# Patient Record
Sex: Female | Born: 1990 | Race: White | Hispanic: No | Marital: Single
Health system: Southern US, Community
[De-identification: ages and names within clinical notes are randomized; demographics above are authoritative.]

---

## 2012-01-04 ENCOUNTER — Emergency Department: Payer: Self-pay | Admitting: Emergency Medicine

## 2012-01-04 LAB — URINALYSIS, COMPLETE
Bilirubin,UR: NEGATIVE
Blood: NEGATIVE
Ketone: NEGATIVE
Ph: 7 (ref 4.5–8.0)
Protein: NEGATIVE
RBC,UR: 1 /HPF (ref 0–5)

## 2012-01-04 LAB — WET PREP, GENITAL

## 2012-04-07 ENCOUNTER — Observation Stay: Payer: Self-pay

## 2012-04-07 LAB — URINALYSIS, COMPLETE
Bacteria: NONE SEEN
Bilirubin,UR: NEGATIVE
Blood: NEGATIVE
Glucose,UR: NEGATIVE mg/dL (ref 0–75)
Ketone: NEGATIVE
Leukocyte Esterase: NEGATIVE
Ph: 8 (ref 4.5–8.0)
Protein: NEGATIVE
Specific Gravity: 1.005 (ref 1.003–1.030)
Squamous Epithelial: 1
WBC UR: <1 /HPF (ref 0–5)

## 2012-05-22 ENCOUNTER — Observation Stay: Payer: Self-pay | Admitting: Obstetrics and Gynecology

## 2012-05-23 ENCOUNTER — Observation Stay: Payer: Self-pay | Admitting: Obstetrics and Gynecology

## 2012-05-25 ENCOUNTER — Observation Stay: Payer: Self-pay | Admitting: Obstetrics and Gynecology

## 2012-05-26 ENCOUNTER — Inpatient Hospital Stay: Payer: Self-pay | Admitting: Obstetrics and Gynecology

## 2012-05-27 LAB — HEMATOCRIT: HCT: 34.9 % — ABNORMAL LOW (ref 35.0–47.0)

## 2012-05-30 LAB — PATHOLOGY REPORT

## 2013-12-20 IMAGING — US US OB LIMITED
1 series · 14 of 28 positions shown · non-contrast
Comparison: none

REASON FOR EXAM: abdominal pain
COMMENTS:   May transport without cardiac monitor

[Series 1: us ob limited · 0.28mm/px · 14 of 43 slices shown]
[im 2/43]
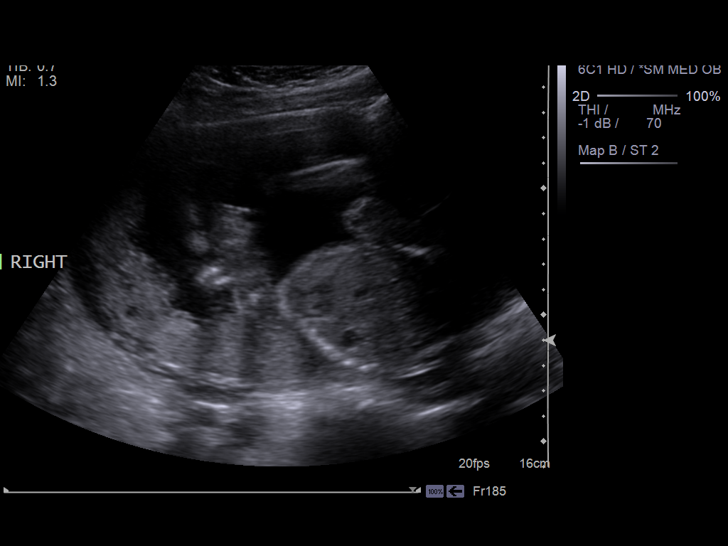
[im 5/43]
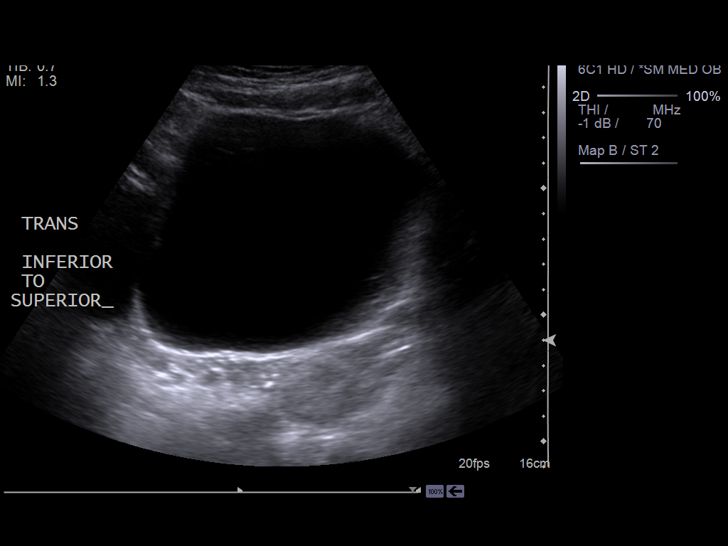
[im 8/43]
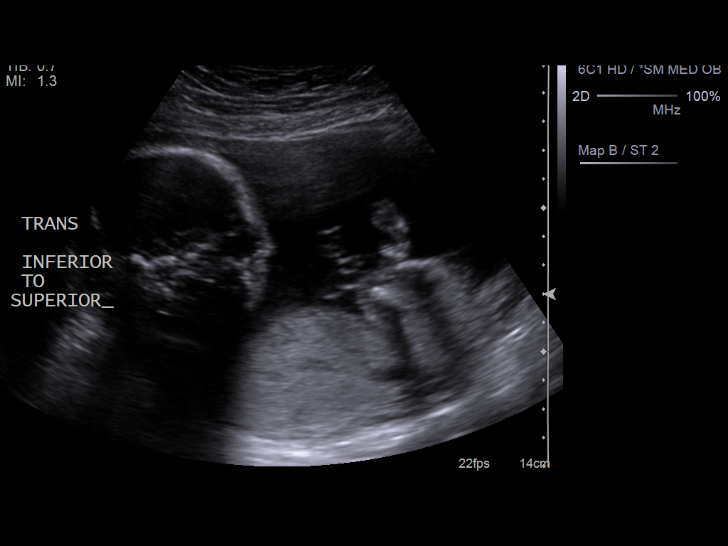
[im 11/43]
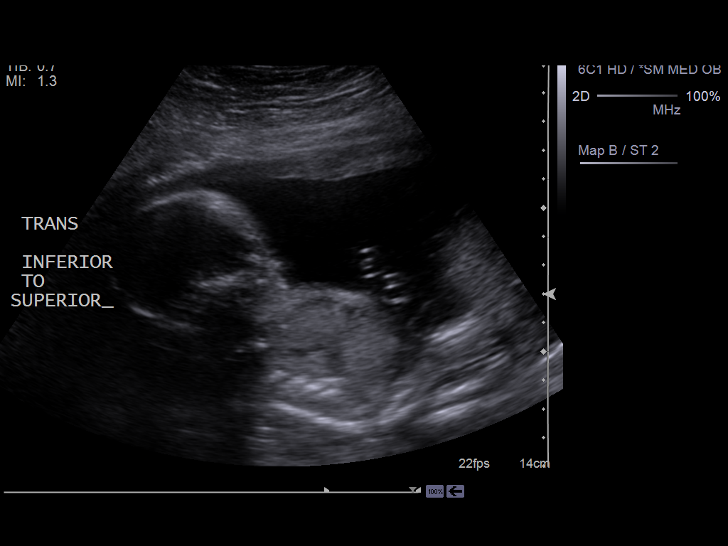
[im 15/43]
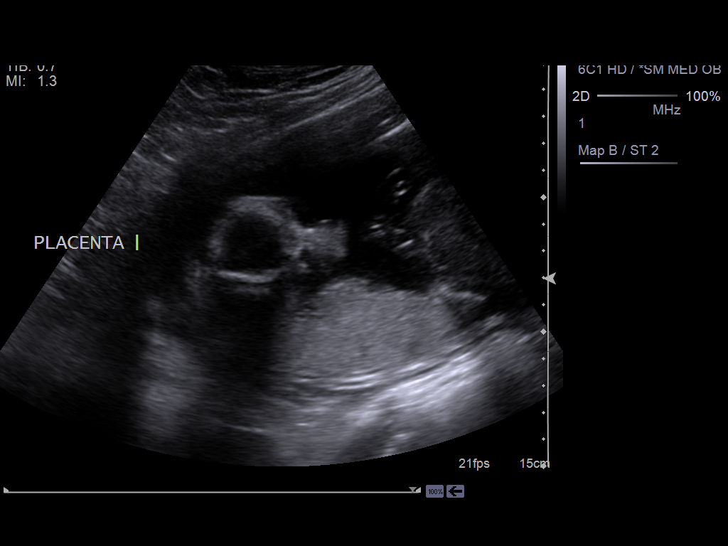
[im 18/43]
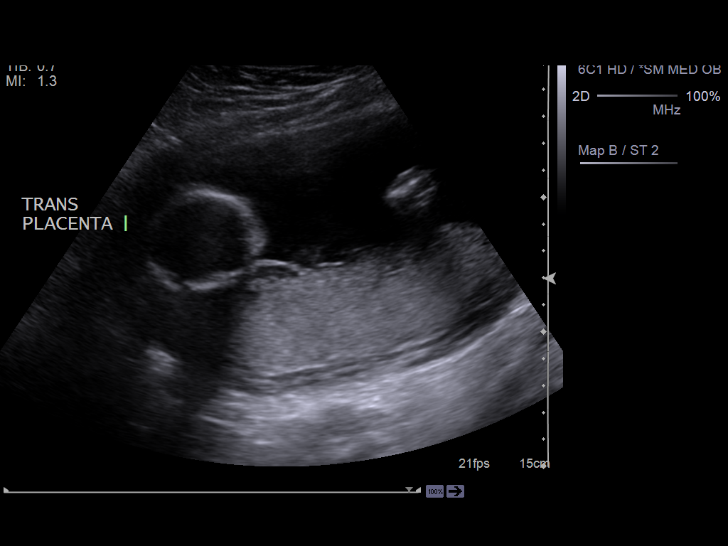
[im 21/43]
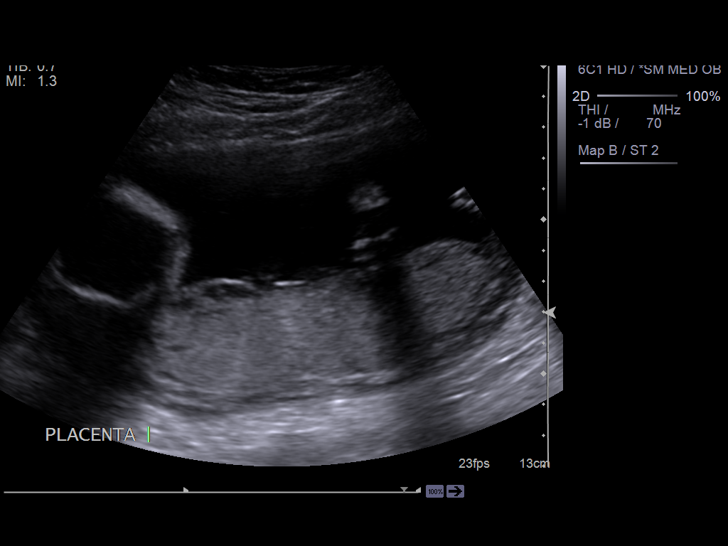
[im 24/43]
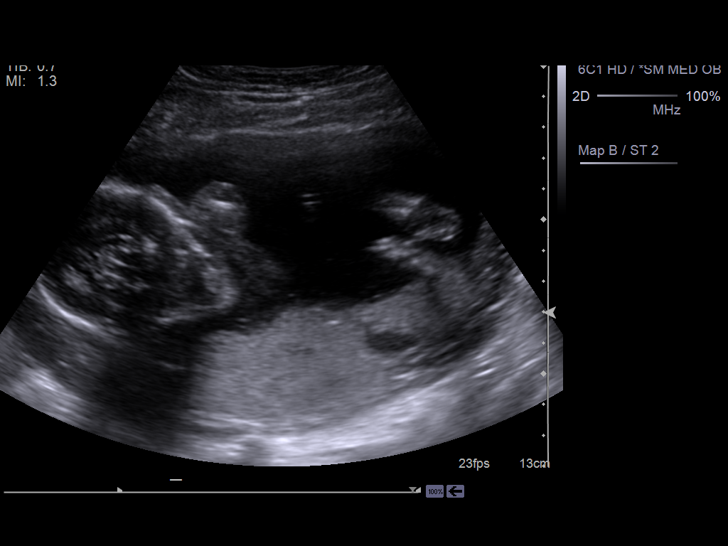
[im 27/43]
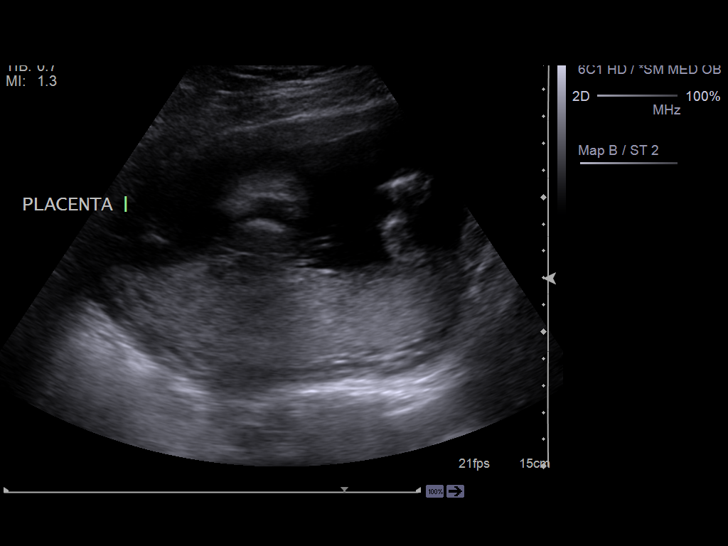
[im 30/43]
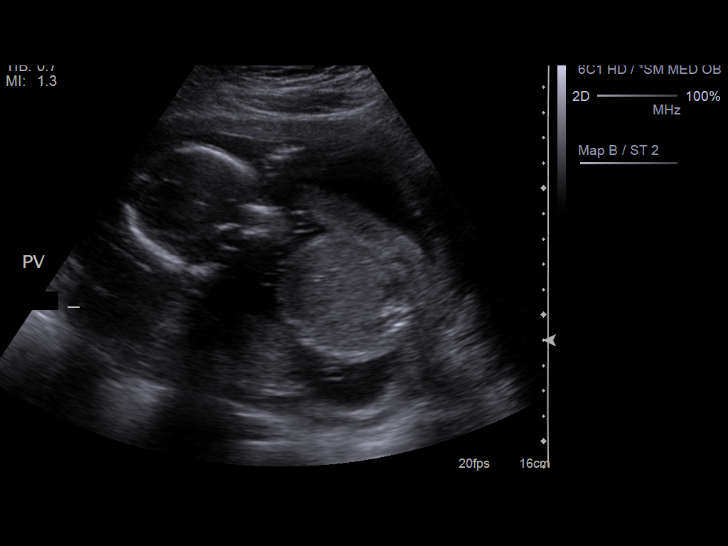
[im 33/43]
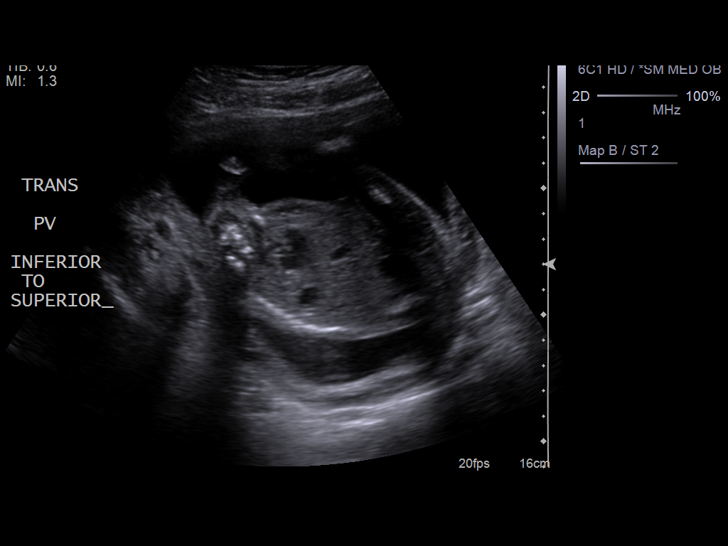
[im 36/43]
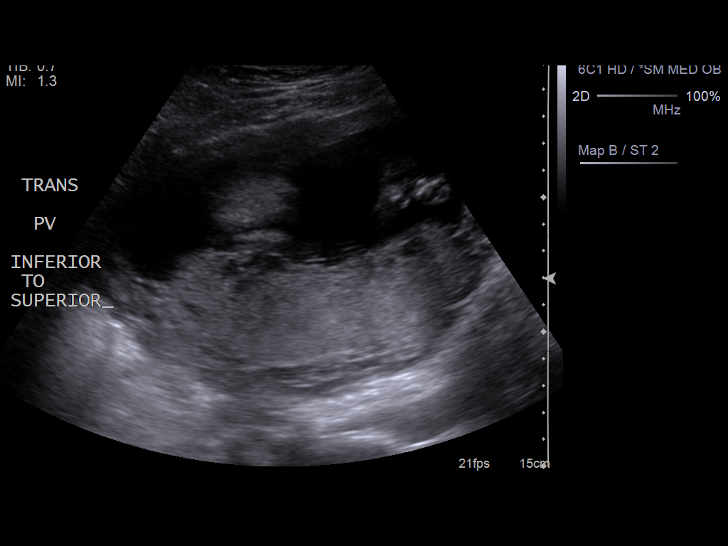
[im 39/43]
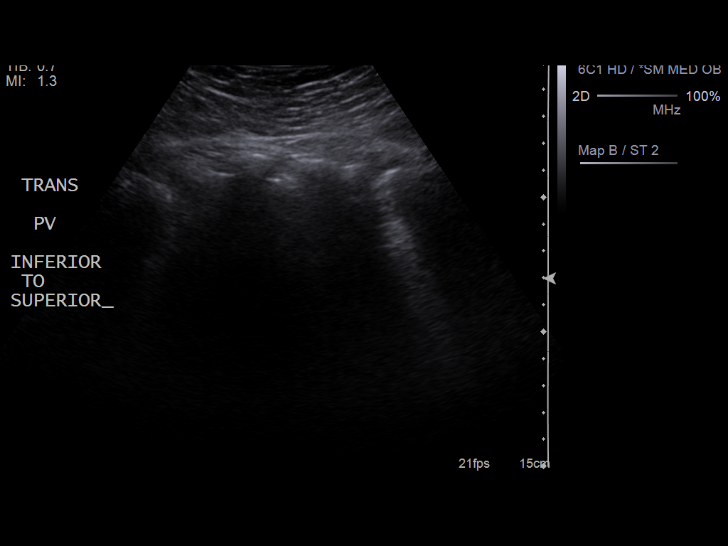
[im 43/43]
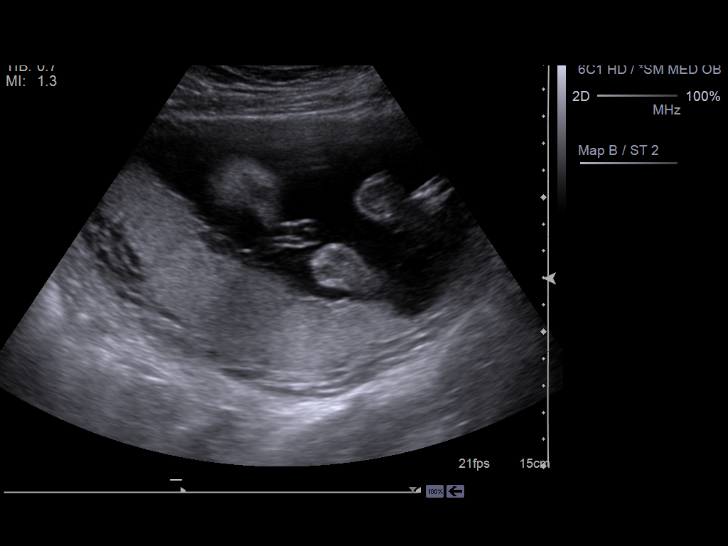

[14 of 28 positions shown; findings below may reference images not displayed]

PROCEDURE:     US  - US LIMITED OB  - January 04, 2012 [DATE]

RESULT:     Single viable intrauterine pregnancy is present in breech
presentation. Fetal measurements and anatomic survey not performed. Fetal
heart rate of 147 beats per minute noted. Placenta is posterior and appears
normal. Amniotic fluid volume is normal. Cervix measures 2.6 centimeters and
appears normal.
IMPRESSION: Single viable intrauterine pregnancy in breech presentation.

## 2014-06-14 NOTE — Op Note (Signed)
PATIENT NAME:  April Sparks, Floy MR#:  [id number] DATE OF BIRTH:  1990/11/19  DATE OF PROCEDURE:  05/27/2012  PREOPERATIVE DIAGNOSIS:  Postpartum, desires permanent sterilization.   POSTOPERATIVE DIAGNOSIS:  Postpartum, desires permanent sterilization.  PROCEDURE:  Postpartum bilateral fallopian tubal ligation.   ANESTHESIA:  General.  SURGEON:  Thomasene MohairStephen Jackson, M.D.   ESTIMATED BLOOD LOSS:  Minimal.   OPERATIVE FLUIDS:  400 mL.   COMPLICATIONS:  None.   FINDINGS:  Normal-appearing fallopian tubes with fimbriated ends identified prior to ligation.   SPECIMENS:  Portion of right and left fallopian tube.   CONDITION:  Stable and unchanged.   INDICATIONS FOR PROCEDURE:  The patient is a 24 year old gravida 2, para 2-0-0-2 female who was counseled extensively regarding contraception options postpartum.  She strongly desires to have definitive permanent sterilization.  She has signed her papers for sterilization back in January which is almost three months ago.  Prior to the procedure I reviewed the risks and benefits and alternative methods of contraception including long-acting reversible forms of contraception whose efficacy is equal to that of a tubal ligation.  Further I explained to her that given her age, the most common complication is regret.  I also discussed the failure rate of approximately 4 in 1000 surgeries resulting in pregnancy with increased risk of tubal pregnancy or ectopic pregnancy should a pregnancy occur.  Given the above facts that she considered and has been considering, she still elected to undergo permanent form of sterilization.  For this, she was taken to the operating room.   PROCEDURE IN DETAIL:  The patient was taken to the operating room where general anesthesia was administered and found to be adequate.  She was placed in a dorsal supine position and prepped and draped in the usual sterile fashion.  After a timeout was called, just below the umbilicus was injected  with 0.25% Marcaine with epinephrine for local anesthetic and a transverse infraumbilical incision was made approximately 2.5 to 3 cm in length.  This incision was then followed down through the various layers until the peritoneum was identified and entered sharply.  Once inside the abdominal cavity the patient was airplaned to her right side and the left fallopian tube was identified and grasped with a Babcock clamp and followed out to the fimbriated end.  Then the fallopian tube was followed back using Babcock clamps until a portion that was approximately 3 cm from the cornual region.  An approximately 3 cm portion was suture-ligated using 0 plain gut using a total of 2 sutures to accomplish the ligation.  This was accomplished using the Pomeroy method.  A portion of the segment was then cut for pathology and hemostasis was assured.  After hemostasis was assured the fallopian tube was then returned to the abdomen and the patient was airplaned toward the left and in a similar fashion the right fallopian tube was identified, followed out to the fimbriated edge, and then followed back to the segment, a portion of the tube approximately 3 cm from the cornual region where again suture-ligation occurred using 0 plain gut with 2 sutures using the Pomeroy method.  A segment of tube was removed and hemostasis was obtained.  The tube was then returned to the abdomen.   The fascia in the sub infraumbilical incision was closed using #0 Vicryl on a UR-6 needle.  The skin was closed using 3-0 Vicryl undyed in a subcuticular fashion.  This closure was then reinforced using Dermabond.  An additional 10 mL of  local anesthetic as described above was then injected on either side of the incision for further pain control.   The patient tolerated the procedure well.  Sponge, lap, and needle counts were correct x 2.  The patient was awakened in the operating room at the end of the procedure and transported to the recovery area in  stable condition.     ____________________________ Conard Novak, MD sdj:ea D: 05/27/2012 13:52:16 ET T: 05/28/2012 03:04:07 ET JOB#: [id number]  cc: Conard Novak, MD, <Dictator> Conard Novak MD ELECTRONICALLY SIGNED 06/22/2012 8:35

## 2014-07-02 NOTE — H&P (Signed)
L&D Evaluation:  History Expanded:  HPI 24 YO g2p1001 PRESENTS AT 40 w 6 days for NST from [facility], got tdap on 03/09/12 will be induced on 4/4 for cervidil   Gravida 2   Term 1   Blood Type (Maternal) O positive   Group B Strep Results Maternal (Result >5wks must be treated as unknown) negative   Maternal HIV Negative   Maternal Syphilis Ab Nonreactive   Maternal Varicella Immune   Rubella Results (Maternal) immune   Maternal [facility]Dap Immune   [facility]   Presents with post dates   Patient's Medical History gestational disbetes with previous preg   Patient's Surgical History none   Medications Pre Natal Vitamins   Allergies NKDA   Social History tobacco   Family History Non-Contributory   Current Prenatal Course Notable For Post dates   ROS:  ROS All systems were reviewed.  HEENT, CNS, GI, GU, Respiratory, CV, Renal and Musculoskeletal systems were found to be normal.   Exam:  Vital Signs stable   General no apparent distress   Mental Status clear   Chest clear   Heart normal sinus rhythm   Abdomen gravid, non-tender   Estimated Fetal Weight Average for gestational age   Back no CVAT   Edema no edema   Pelvic cervix closed and thick   Mebranes Intact, Spontaneous rupture of membranes is negative   FHT normal rate with no decels   FHT Description cat 1 strictly reactive   Fetal Heart Rate 140   Ucx absent   Skin dry   Lymph no lymphadenopathy   Impression:  Impression post dates   Plan:  Plan EFM/NST   Comments admit in fri night fro cervidil and pit on sat   Follow Up Appointment need to schedule   Electronic Signatures: Adria DevonKlett, Yavonne Kiss (MD)  (Signed 03-Apr-14 15:51)  Authored: L&D Evaluation   Last Updated: 03-Apr-14 15:51 by Adria DevonKlett, Chaitanya Amedee (MD)

## 2014-07-02 NOTE — H&P (Signed)
L&D Evaluation:  History Expanded:  HPI 24 yo G2P1001 whose EDC = 05/26/12.  Pt followed at [facility] for this pregnancy.  Pt was to be induiced tonight.  Pt presented in advanced labor and delivered inb the ER.  Pt alos desires Pptl AND HAS SIGNED HER PAPERS 03/09/12.   Gravida CHD   Blood Type (Maternal) O positive   Group B Strep Results Maternal (Result >5wks must be treated as unknown) negative   Maternal HIV Negative   Maternal Syphilis Ab Nonreactive   Rubella Results (Maternal) immune   EDC 19-May-2012   Presents with abdominal pain   Patient's Medical History Asthma  High risk for STDs   Patient's Surgical History none   Medications Pre Natal Vitamins   Allergies NKDA   Social History tobacco   Exam:  Vital Signs stable   General no apparent distress   Mental Status clear   Chest clear   Heart normal sinus rhythm   Abdomen postpartum   FHT Description delivered   Impression:  Impression Delivered in the ER   Electronic Signatures: Towana Badgerosenow, Bennette Hasty J (MD)  (Signed 04-Apr-14 09:07)  Authored: L&D Evaluation   Last Updated: 04-Apr-14 09:07 by Towana Badgerosenow, Chanay Nugent J (MD)

## 2014-07-02 NOTE — H&P (Signed)
L&D Evaluation:  History Expanded:  HPI 24 YO g2p1001 PRESENTS AT [redacted] WEEKS PREGNANT C/O BACK PAIN, lap, POSSIBLE Spontaneous rupture of membranes.   Gravida 2   Term 1   Presents with back pain, contractions   Patient's Medical History gestational disbetes with previous preg   Patient's Surgical History none   Medications Pre Natal Vitamins   Allergies NKDA   Social History tobacco   Family History Non-Contributory   ROS:  ROS All systems were reviewed.  HEENT, CNS, GI, GU, Respiratory, CV, Renal and Musculoskeletal systems were found to be normal.   Exam:  Vital Signs stable   General no apparent distress   Mental Status clear   Chest clear   Heart normal sinus rhythm   Abdomen gravid, non-tender   Estimated Fetal Weight Average for gestational age   Back no CVAT   Edema no edema   Pelvic cervix closed and thick   Mebranes Intact, Spontaneous rupture of membranes is negative   FHT normal rate with no decels   Impression:  Impression Back pain, LAP   Plan:  Comments urine C+S, mopmnitor, will probably allow to go home after evaluation   Electronic Signatures: Towana Badgerosenow, Philip J (MD)  (Signed 14-Feb-14 15:47)  Authored: L&D Evaluation   Last Updated: 14-Feb-14 15:47 by Towana Badgerosenow, Philip J (MD)
